# Patient Record
Sex: Male | Born: 1950 | Race: White | Hispanic: No | Marital: Married | Smoking: Never smoker
Health system: Southern US, Community
[De-identification: ages and names within clinical notes are randomized; demographics above are authoritative.]

## PROBLEM LIST (undated history)

## (undated) DIAGNOSIS — I2510 Atherosclerotic heart disease of native coronary artery without angina pectoris: Secondary | ICD-10-CM

## (undated) DIAGNOSIS — I10 Essential (primary) hypertension: Secondary | ICD-10-CM

## (undated) DIAGNOSIS — E119 Type 2 diabetes mellitus without complications: Secondary | ICD-10-CM

## (undated) HISTORY — PX: CARDIAC SURGERY: SHX584

## (undated) HISTORY — PX: VASECTOMY: SHX75

## (undated) HISTORY — PX: HERNIA REPAIR: SHX51

---

## 2017-09-26 ENCOUNTER — Emergency Department (HOSPITAL_COMMUNITY)
Admission: EM | Admit: 2017-09-26 | Discharge: 2017-09-26 | Disposition: A | Discharge status: Home / Self Care | Payer: PRIVATE HEALTH INSURANCE | Attending: Emergency Medicine | Admitting: Emergency Medicine

## 2017-09-26 ENCOUNTER — Encounter (HOSPITAL_COMMUNITY): Payer: Self-pay | Admitting: Emergency Medicine

## 2017-09-26 ENCOUNTER — Emergency Department (HOSPITAL_COMMUNITY): Payer: PRIVATE HEALTH INSURANCE

## 2017-09-26 DIAGNOSIS — Z7982 Long term (current) use of aspirin: Secondary | ICD-10-CM | POA: Insufficient documentation

## 2017-09-26 DIAGNOSIS — I10 Essential (primary) hypertension: Secondary | ICD-10-CM | POA: Insufficient documentation

## 2017-09-26 DIAGNOSIS — E119 Type 2 diabetes mellitus without complications: Secondary | ICD-10-CM | POA: Insufficient documentation

## 2017-09-26 DIAGNOSIS — I2510 Atherosclerotic heart disease of native coronary artery without angina pectoris: Secondary | ICD-10-CM | POA: Insufficient documentation

## 2017-09-26 DIAGNOSIS — R072 Precordial pain: Secondary | ICD-10-CM | POA: Diagnosis not present

## 2017-09-26 DIAGNOSIS — Z79899 Other long term (current) drug therapy: Secondary | ICD-10-CM | POA: Diagnosis not present

## 2017-09-26 DIAGNOSIS — Z951 Presence of aortocoronary bypass graft: Secondary | ICD-10-CM | POA: Diagnosis not present

## 2017-09-26 HISTORY — DX: Type 2 diabetes mellitus without complications: E11.9

## 2017-09-26 HISTORY — DX: Essential (primary) hypertension: I10

## 2017-09-26 HISTORY — DX: Atherosclerotic heart disease of native coronary artery without angina pectoris: I25.10

## 2017-09-26 LAB — COMPREHENSIVE METABOLIC PANEL
ALBUMIN: 4 g/dL (ref 3.5–5.0)
ALT: 26 U/L (ref 17–63)
ANION GAP: 6 (ref 5–15)
AST: 23 U/L (ref 15–41)
Alkaline Phosphatase: 108 U/L (ref 38–126)
BILIRUBIN TOTAL: 0.7 mg/dL (ref 0.3–1.2)
BUN: 17 mg/dL (ref 6–20)
CO2: 28 mmol/L (ref 22–32)
Calcium: 9 mg/dL (ref 8.9–10.3)
Chloride: 107 mmol/L (ref 101–111)
Creatinine, Ser: 0.65 mg/dL (ref 0.61–1.24)
GFR calc Af Amer: >60 mL/min (ref >60)
GFR calc non Af Amer: >60 mL/min (ref >60)
GLUCOSE: 130 mg/dL — AB (ref 65–99)
POTASSIUM: 4.4 mmol/L (ref 3.5–5.1)
Sodium: 141 mmol/L (ref 135–145)
TOTAL PROTEIN: 7.1 g/dL (ref 6.5–8.1)

## 2017-09-26 LAB — TROPONIN I: Troponin I: <0.03 ng/mL (ref <0.03)

## 2017-09-26 LAB — CBC
HEMATOCRIT: 39.5 % (ref 39.0–52.0)
HEMOGLOBIN: 13.3 g/dL (ref 13.0–17.0)
MCH: 29.6 pg (ref 26.0–34.0)
MCHC: 33.7 g/dL (ref 30.0–36.0)
MCV: 87.8 fL (ref 78.0–100.0)
Platelets: 228 10*3/uL (ref 150–400)
RBC: 4.5 MIL/uL (ref 4.22–5.81)
RDW: 13.3 % (ref 11.5–15.5)
WBC: 6.1 10*3/uL (ref 4.0–10.5)

## 2017-09-26 NOTE — ED Provider Notes (Signed)
[facility] EMERGENCY DEPARTMENT Provider Note   CSN: [id number] Arrival date & time: 09/26/17  1043     History   Chief Complaint Chief Complaint  Patient presents with  . Chest Pain    HPI Joshua Meza is a 66 y.o. male.  HPI Patient is a 66 year old male with a known history of coronary artery disease status post CABG.  He presents to the emergency department with central chest discomfort that radiated higher into his chest with some mild associated shortness of breath.  2 nitroglycerin were given with complete resolution of his symptoms.  His symptoms lasted for 10-15 minutes.  He had one similar episode 2 weeks ago which he did not seek medical care for at the time of his symptoms.  He did follow-up with his cardiologist last week and was scheduled for outpatient echocardiogram and outpatient stress test.  He has had no recurrent symptoms until this morning.  Asymptomatic at this time.  Compliant with his medications including 81 mg aspirin daily Plavix.  Does not smoke cigarettes.  No recent upper respiratory symptoms.  Denies unilateral leg swelling.  No history of DVT or pulmonary embolism.   Past Medical History:  Diagnosis Date  . Coronary artery disease   . Diabetes mellitus without complication (HCC)   . Hypertension     There are no active problems to display for this patient.   Past Surgical History:  Procedure Laterality Date  . CARDIAC SURGERY    . HERNIA REPAIR    . VASECTOMY         Home Medications    Prior to Admission medications   Medication Sig Start Date End Date Taking? Authorizing Provider  aspirin EC 81 MG tablet Take 81 mg by mouth daily.   Yes [provider]  atorvastatin (LIPITOR) 40 MG tablet Take 40 mg by mouth daily.   Yes [provider]  clopidogrel (PLAVIX) 75 MG tablet Take 75 mg by mouth daily.   Yes [provider]  metoprolol tartrate (LOPRESSOR) 50 MG tablet Take 50 mg by mouth 2 (two) times daily.    Yes [provider]  nitroGLYCERIN (NITROSTAT) 0.4 MG SL tablet Place 0.4 mg under the tongue every 5 (five) minutes as needed for chest pain.   Yes [provider]  olmesartan (BENICAR) 20 MG tablet Take 20 mg by mouth daily.   Yes [provider]    Family History No family history on file.  Social History Social History  Substance Use Topics  . Smoking status: Never Smoker  . Smokeless tobacco: Never Used  . Alcohol use No     Allergies   Patient has no known allergies.   Review of Systems Review of Systems  All other systems reviewed and are negative.    Physical Exam Updated Vital Signs BP 112/71   Pulse (!) 50   Temp 97.7 F (36.5 C) (Oral)   Resp 18   Ht 5' 6.5" (1.689 m)   Wt 115.2 kg (254 lb)   SpO2 95%   BMI 40.38 kg/m   Physical Exam  Constitutional: He is oriented to person, place, and time. He appears well-developed and well-nourished.  HENT:  Head: Normocephalic and atraumatic.  Eyes: EOM are normal.  Neck: Normal range of motion.  Cardiovascular: Normal rate, regular rhythm, normal heart sounds and intact distal pulses.   Pulmonary/Chest: Effort normal and breath sounds normal. No respiratory distress.  Abdominal: Soft. He exhibits no distension. There is no tenderness.  Musculoskeletal: Normal range of motion. He exhibits no edema.  Neurological: He is alert and oriented to person, place, and time.  Skin: Skin is warm and dry.  Psychiatric: He has a normal mood and affect. Judgment normal.  Nursing note and vitals reviewed.    ED Treatments / Results  Labs (all labs ordered are listed, but only abnormal results are displayed) Labs Reviewed  COMPREHENSIVE METABOLIC PANEL - Abnormal; Notable for the following:       Result Value   Glucose, Bld 130 (*)    All other components within normal limits  CBC  TROPONIN I  TROPONIN I    EKG  EKG Interpretation #1  Date/Time:  Saturday September 26 2017 10:57:13  EDT Ventricular Rate:  50 PR Interval:    QRS Duration: 99 QT Interval:  411 QTC Calculation: 375 R Axis:   -39 Text Interpretation:  Sinus rhythm Prolonged PR interval Left axis deviation Abnormal R-wave progression, late transition No old tracing to compare Confirmed by Azalia Bilis ([id number]) on 09/26/2017 11:05:21 AM        EKG Interpretation #2 Date/Time:  Saturday September 26 2017 13:42:13 EDT Ventricular Rate:  53 PR Interval:    QRS Duration: 101 QT Interval:  426 QTC Calculation: 400 R Axis:   -38 Text Interpretation:  Sinus rhythm Atrial premature complex Prolonged PR interval Left axis deviation Consider anterior infarct No significant change was found Confirmed by Azalia Bilis ([id number]) on 09/26/2017 2:54:10 PM        Radiology Dg Chest 2 View  Result Date: 09/26/2017 CLINICAL DATA:  Chest pain since this morning. EXAM: CHEST  2 VIEW COMPARISON:  None. FINDINGS: There is bilateral mild diffuse interstitial thickening. There is mild left basilar atelectasis. There is no focal consolidation. There is no pleural effusion or pneumothorax. The heart and mediastinum are stable. There is evidence of prior CABG. The osseous structures are unremarkable. IMPRESSION: 1. Mild bilateral interstitial thickening likely reflecting interstitial edema. Electronically Signed   By: Elige Ko   On: 09/26/2017 11:54    Procedures Procedures (including critical care time)  Medications Ordered in ED Medications - No data to display   Initial Impression / Assessment and Plan / ED Course  I have reviewed the triage vital signs and the nursing notes.  Pertinent labs & imaging results that were available during my care of the patient were reviewed by me and considered in my medical decision making (see chart for details).     Overall well-appearing.  EKG without ischemic changes.  Initial troponin is negative.  Patient will undergo serial EKG and serial troponin x2.  If these are both  normal and unchanged I think it is reasonable that the patient continue to follow up as an outpatient for his outpatient cardiology workup.  This may represent anginahowever the patient has close follow-up and has the ability to return to the ER for new or worsening symptoms.  2:54 PM Patient continues to feel good.  No complaints of chest pain or recurrence of his symptoms while in the emergency department.  EKG without ischemic changes x2.  Troponin negative x2.  Outpatient cardiology follow-up.  Patient understands return to the ER for new or worsening symptoms  Final Clinical Impressions(s) / ED Diagnoses   Final diagnoses:  None    New Prescriptions New Prescriptions   No medications on file     Azalia Bilis, MD 09/26/17 1455

## 2017-09-26 NOTE — Discharge Instructions (Signed)
Please call your cardiologist for follow up  Please return to the emergency department for new or worsening symptoms

## 2017-09-26 NOTE — ED Triage Notes (Signed)
Pt states he was sitting in a counseling session at work x 15 min pta and began having central cp with slight SOB. No complaints at present.

## 2018-07-05 IMAGING — DX DG CHEST 2V
2 series · 2 of 2 positions shown · non-contrast
Comparison: None.

CLINICAL DATA: Chest pain since this morning.

EXAM:
CHEST  2 VIEW

[chest pa]
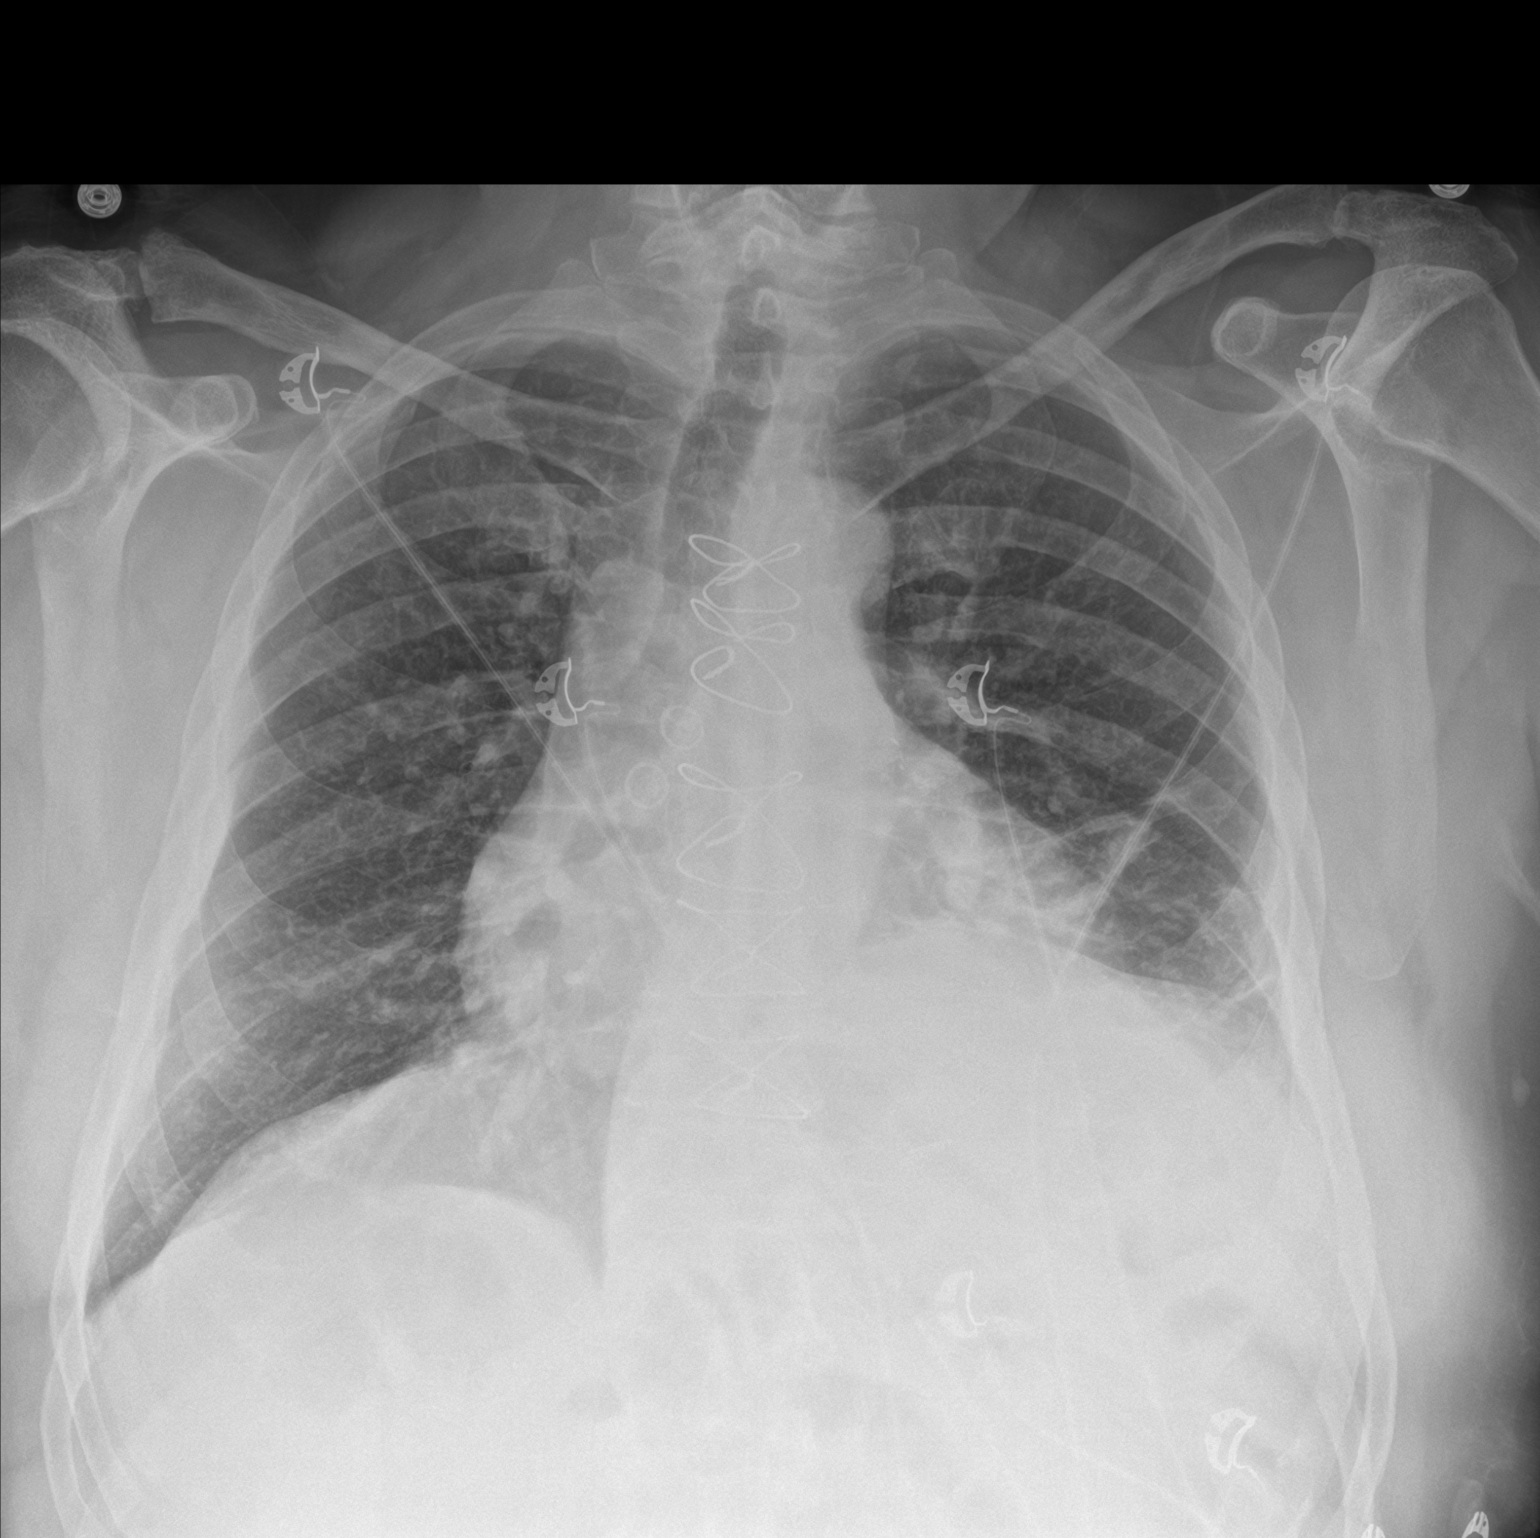

[chest lat]
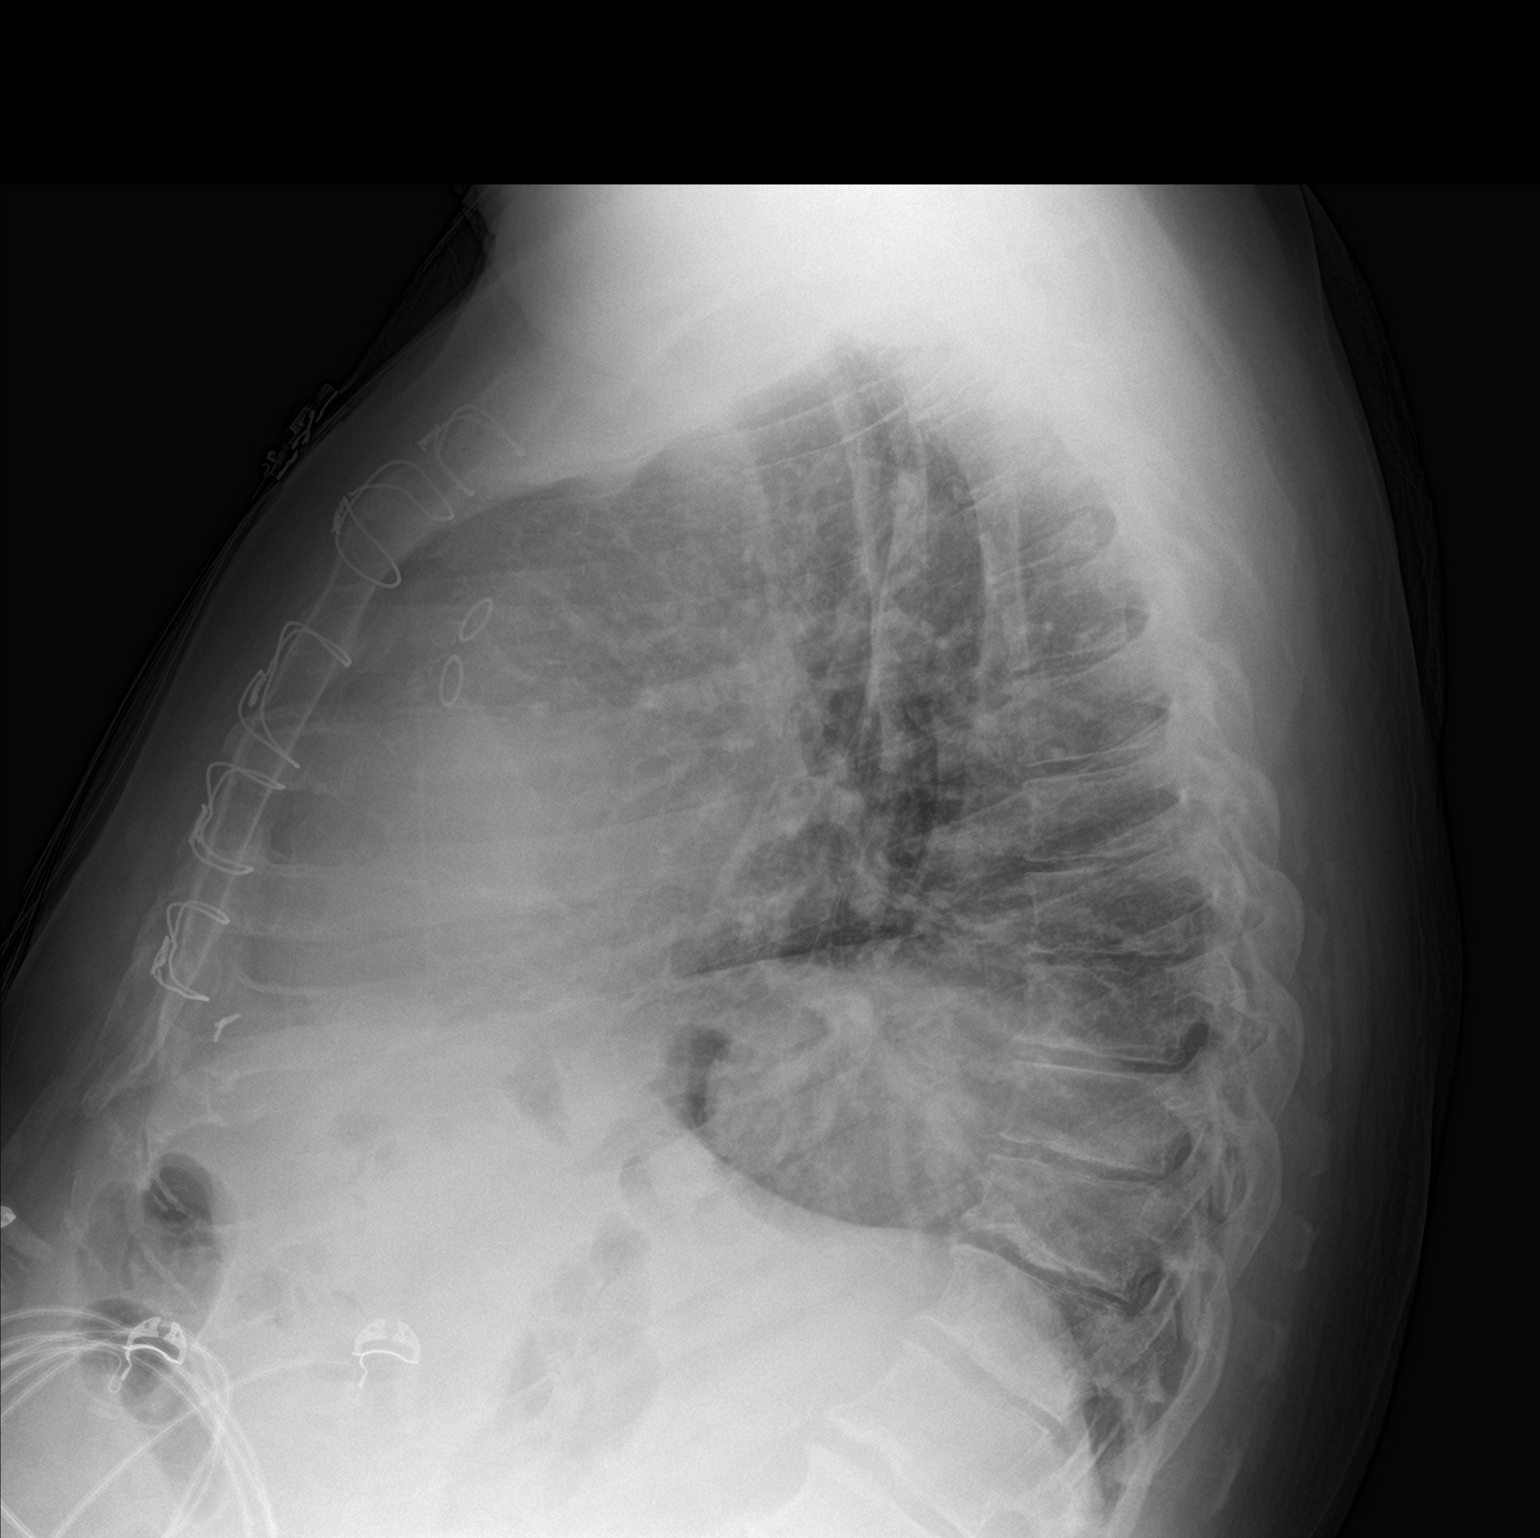

[2 of 2 positions shown; findings below may reference images not displayed]

FINDINGS: There is bilateral mild diffuse interstitial thickening. There is
mild left basilar atelectasis. There is no focal consolidation.
There is no pleural effusion or pneumothorax. The heart and
mediastinum are stable. There is evidence of prior CABG.

The osseous structures are unremarkable.
IMPRESSION: 1. Mild bilateral interstitial thickening likely reflecting
interstitial edema.
